# Patient Record
Sex: Male | Born: 1975 | Race: White | Hispanic: No | Marital: Married
Health system: Southern US, Community
[De-identification: ages and names within clinical notes are randomized; demographics above are authoritative.]

---

## 2020-04-02 ENCOUNTER — Inpatient Hospital Stay (HOSPITAL_COMMUNITY)
Admission: EM | Admit: 2020-04-02 | Discharge: 2020-04-02 | DRG: 208 | Disposition: A | Discharge status: Other Acute Inpatient Hospital | Payer: HRSA Program | Attending: Pulmonary Disease | Admitting: Pulmonary Disease

## 2020-04-02 ENCOUNTER — Emergency Department (HOSPITAL_COMMUNITY): Payer: HRSA Program

## 2020-04-02 ENCOUNTER — Inpatient Hospital Stay (HOSPITAL_COMMUNITY): Payer: HRSA Program

## 2020-04-02 DIAGNOSIS — J9601 Acute respiratory failure with hypoxia: Secondary | ICD-10-CM

## 2020-04-02 DIAGNOSIS — J1282 Pneumonia due to coronavirus disease 2019: Secondary | ICD-10-CM | POA: Diagnosis present

## 2020-04-02 DIAGNOSIS — Z452 Encounter for adjustment and management of vascular access device: Secondary | ICD-10-CM

## 2020-04-02 DIAGNOSIS — J9600 Acute respiratory failure, unspecified whether with hypoxia or hypercapnia: Secondary | ICD-10-CM | POA: Diagnosis present

## 2020-04-02 DIAGNOSIS — J80 Acute respiratory distress syndrome: Secondary | ICD-10-CM | POA: Diagnosis present

## 2020-04-02 DIAGNOSIS — U071 COVID-19: Secondary | ICD-10-CM

## 2020-04-02 DIAGNOSIS — Z0189 Encounter for other specified special examinations: Secondary | ICD-10-CM

## 2020-04-02 DIAGNOSIS — C9500 Acute leukemia of unspecified cell type not having achieved remission: Secondary | ICD-10-CM | POA: Diagnosis present

## 2020-04-02 DIAGNOSIS — R140 Abdominal distension (gaseous): Secondary | ICD-10-CM | POA: Insufficient documentation

## 2020-04-02 DIAGNOSIS — K7030 Alcoholic cirrhosis of liver without ascites: Secondary | ICD-10-CM | POA: Diagnosis present

## 2020-04-02 DIAGNOSIS — D72829 Elevated white blood cell count, unspecified: Secondary | ICD-10-CM | POA: Insufficient documentation

## 2020-04-02 LAB — CBC WITH DIFFERENTIAL/PLATELET
Abs Immature Granulocytes: 194.3 10*3/uL — ABNORMAL HIGH (ref 0.00–0.07)
Band Neutrophils: 16 %
Basophils Absolute: 25.9 10*3/uL — ABNORMAL HIGH (ref 0.0–0.1)
Basophils Relative: 4 %
Eosinophils Absolute: 45.3 10*3/uL — ABNORMAL HIGH (ref 0.0–0.5)
Eosinophils Relative: 7 %
HCT: 24.2 % — ABNORMAL LOW (ref 39.0–52.0)
Hemoglobin: 9.1 g/dL — ABNORMAL LOW (ref 13.0–17.0)
Lymphocytes Relative: 2 %
Lymphs Abs: 13 10*3/uL — ABNORMAL HIGH (ref 0.7–4.0)
MCH: 29 pg (ref 26.0–34.0)
MCHC: 37.6 g/dL — ABNORMAL HIGH (ref 30.0–36.0)
MCV: 77.1 fL — ABNORMAL LOW (ref 80.0–100.0)
Metamyelocytes Relative: 13 %
Monocytes Absolute: 0 10*3/uL — ABNORMAL LOW (ref 0.1–1.0)
Monocytes Relative: 0 %
Myelocytes: 11 %
Neutro Abs: 297.9 10*3/uL — ABNORMAL HIGH (ref 1.7–7.7)
Neutrophils Relative %: 30 %
Other: 11 %
Platelets: 375 10*3/uL (ref 150–400)
Promyelocytes Relative: 6 %
RBC: 3.14 MIL/uL — ABNORMAL LOW (ref 4.22–5.81)
RDW: 21.4 % — ABNORMAL HIGH (ref 11.5–15.5)
Smear Review: NORMAL
WBC: 647.6 10*3/uL (ref 4.0–10.5)
nRBC: 1.3 % — ABNORMAL HIGH (ref 0.0–0.2)

## 2020-04-02 LAB — I-STAT ARTERIAL BLOOD GAS, ED
Acid-Base Excess: 0 mmol/L (ref 0.0–2.0)
Acid-base deficit: 1 mmol/L (ref 0.0–2.0)
Acid-base deficit: 8 mmol/L — ABNORMAL HIGH (ref 0.0–2.0)
Bicarbonate: 24.1 mmol/L (ref 20.0–28.0)
Bicarbonate: 22.4 mmol/L (ref 20.0–28.0)
Bicarbonate: 24.4 mmol/L (ref 20.0–28.0)
Calcium, Ion: 1.29 mmol/L (ref 1.15–1.40)
Calcium, Ion: 1.29 mmol/L (ref 1.15–1.40)
Calcium, Ion: 1.33 mmol/L (ref 1.15–1.40)
HCT: 51 % (ref 39.0–52.0)
HCT: 52 % (ref 39.0–52.0)
HCT: 56 % — ABNORMAL HIGH (ref 39.0–52.0)
Hemoglobin: 17.3 g/dL — ABNORMAL HIGH (ref 13.0–17.0)
Hemoglobin: 17.7 g/dL — ABNORMAL HIGH (ref 13.0–17.0)
Hemoglobin: 19 g/dL — ABNORMAL HIGH (ref 13.0–17.0)
O2 Saturation: 59 %
O2 Saturation: 86 %
O2 Saturation: 85 %
Patient temperature: 100
Potassium: 3.1 mmol/L — ABNORMAL LOW (ref 3.5–5.1)
Potassium: 3.6 mmol/L (ref 3.5–5.1)
Potassium: 3.6 mmol/L (ref 3.5–5.1)
Sodium: 139 mmol/L (ref 135–145)
Sodium: 138 mmol/L (ref 135–145)
Sodium: 139 mmol/L (ref 135–145)
TCO2: 25 mmol/L (ref 22–32)
TCO2: 23 mmol/L (ref 22–32)
TCO2: 27 mmol/L (ref 22–32)
pCO2 arterial: 37.8 mmHg (ref 32.0–48.0)
pCO2 arterial: 33 mmHg (ref 32.0–48.0)
pCO2 arterial: 81 mmHg (ref 32.0–48.0)
pH, Arterial: 7.412 (ref 7.350–7.450)
pH, Arterial: 7.439 (ref 7.350–7.450)
pH, Arterial: 7.092 — CL (ref 7.350–7.450)
pO2, Arterial: 30 mmHg — CL (ref 83.0–108.0)
pO2, Arterial: 49 mmHg — ABNORMAL LOW (ref 83.0–108.0)
pO2, Arterial: 73 mmHg — ABNORMAL LOW (ref 83.0–108.0)

## 2020-04-02 LAB — COMPREHENSIVE METABOLIC PANEL
ALT: 22 U/L (ref 0–44)
AST: 78 U/L — ABNORMAL HIGH (ref 15–41)
Albumin: 2.4 g/dL — ABNORMAL LOW (ref 3.5–5.0)
Alkaline Phosphatase: 458 U/L — ABNORMAL HIGH (ref 38–126)
Anion gap: 14 (ref 5–15)
BUN: 14 mg/dL (ref 6–20)
CO2: 21 mmol/L — ABNORMAL LOW (ref 22–32)
Calcium: 8.9 mg/dL (ref 8.9–10.3)
Chloride: 102 mmol/L (ref 98–111)
Creatinine, Ser: 0.72 mg/dL (ref 0.61–1.24)
GFR, Estimated: >60 mL/min (ref >60)
Glucose, Bld: 114 mg/dL — ABNORMAL HIGH (ref 70–99)
Potassium: 5 mmol/L (ref 3.5–5.1)
Sodium: 137 mmol/L (ref 135–145)
Total Bilirubin: 1.3 mg/dL — ABNORMAL HIGH (ref 0.3–1.2)
Total Protein: 6.2 g/dL — ABNORMAL LOW (ref 6.5–8.1)

## 2020-04-02 LAB — PROTIME-INR
INR: 1.2 (ref 0.8–1.2)
Prothrombin Time: 14.7 seconds (ref 11.4–15.2)

## 2020-04-02 LAB — TRIGLYCERIDES: Triglycerides: 261 mg/dL — ABNORMAL HIGH (ref <150)

## 2020-04-02 LAB — LACTATE DEHYDROGENASE: LDH: 1253 U/L — ABNORMAL HIGH (ref 98–192)

## 2020-04-02 LAB — D-DIMER, QUANTITATIVE: D-Dimer, Quant: 3.04 ug/mL-FEU — ABNORMAL HIGH (ref 0.00–0.50)

## 2020-04-02 LAB — POC SARS CORONAVIRUS 2 AG -  ED: SARS Coronavirus 2 Ag: POSITIVE — AB

## 2020-04-02 LAB — APTT: aPTT: 33 seconds (ref 24–36)

## 2020-04-02 LAB — FERRITIN: Ferritin: 216 ng/mL (ref 24–336)

## 2020-04-02 LAB — C-REACTIVE PROTEIN: CRP: 29.5 mg/dL — ABNORMAL HIGH (ref <1.0)

## 2020-04-02 LAB — BRAIN NATRIURETIC PEPTIDE: B Natriuretic Peptide: 177.1 pg/mL — ABNORMAL HIGH (ref 0.0–100.0)

## 2020-04-02 LAB — ETHANOL: Alcohol, Ethyl (B): <10 mg/dL (ref <10)

## 2020-04-02 LAB — AMMONIA: Ammonia: 65 umol/L — ABNORMAL HIGH (ref 9–35)

## 2020-04-02 LAB — LACTIC ACID, PLASMA
Lactic Acid, Venous: 1.9 mmol/L (ref 0.5–1.9)
Lactic Acid, Venous: 1.1 mmol/L (ref 0.5–1.9)

## 2020-04-02 LAB — PROCALCITONIN: Procalcitonin: 1.51 ng/mL

## 2020-04-02 LAB — PATHOLOGIST SMEAR REVIEW

## 2020-04-02 MED ORDER — ETOMIDATE 2 MG/ML IV SOLN
INTRAVENOUS | Status: AC | PRN
  Administered 2020-04-02: 20 mg via INTRAVENOUS

## 2020-04-02 MED ORDER — STERILE WATER FOR INJECTION IV SOLN
INTRAVENOUS | Status: DC
  Filled 2020-04-02: qty 850

## 2020-04-02 MED ORDER — DOCUSATE SODIUM 100 MG PO CAPS: 100.0000 mg | ORAL_CAPSULE | Freq: Two times a day (BID) | ORAL | Status: DC | PRN

## 2020-04-02 MED ORDER — FENTANYL 2500MCG IN NS 250ML (10MCG/ML) PREMIX INFUSION: 0.0000 ug/h | INTRAVENOUS | Status: DC

## 2020-04-02 MED ORDER — SUCCINYLCHOLINE CHLORIDE 200 MG/10ML IV SOSY
PREFILLED_SYRINGE | INTRAVENOUS | Status: AC
  Filled 2020-04-02: qty 10

## 2020-04-02 MED ORDER — MIDAZOLAM HCL 2 MG/2ML IJ SOLN
2.0000 mg | INTRAMUSCULAR | Status: DC | PRN
  Administered 2020-04-02 (×2): 2 mg via INTRAVENOUS
  Filled 2020-04-02 (×2): qty 2

## 2020-04-02 MED ORDER — DEXAMETHASONE SODIUM PHOSPHATE 10 MG/ML IJ SOLN
10.0000 mg | Freq: Once | INTRAMUSCULAR | Status: AC
  Administered 2020-04-02: 10 mg via INTRAVENOUS
  Filled 2020-04-02: qty 1

## 2020-04-02 MED ORDER — FENTANYL 2500MCG IN NS 250ML (10MCG/ML) PREMIX INFUSION
50.0000 ug/h | INTRAVENOUS | Status: DC
  Filled 2020-04-02: qty 250

## 2020-04-02 MED ORDER — POLYETHYLENE GLYCOL 3350 17 G PO PACK: 17.0000 g | PACK | Freq: Every day | ORAL | Status: DC

## 2020-04-02 MED ORDER — ARTIFICIAL TEARS OPHTHALMIC OINT: 1.0000 "application " | TOPICAL_OINTMENT | Freq: Three times a day (TID) | OPHTHALMIC | Status: DC

## 2020-04-02 MED ORDER — SODIUM CHLORIDE 0.9 % IV SOLN
2.0000 g | Freq: Once | INTRAVENOUS | Status: AC
  Administered 2020-04-02: 2 g via INTRAVENOUS
  Filled 2020-04-02: qty 2

## 2020-04-02 MED ORDER — ACETAMINOPHEN 650 MG RE SUPP
650.0000 mg | RECTAL | Status: AC
  Administered 2020-04-02: 650 mg via RECTAL
  Filled 2020-04-02: qty 1

## 2020-04-02 MED ORDER — DOCUSATE SODIUM 50 MG/5ML PO LIQD
100.0000 mg | Freq: Two times a day (BID) | ORAL | Status: DC
  Filled 2020-04-02 (×2): qty 10

## 2020-04-02 MED ORDER — ROCURONIUM BROMIDE 10 MG/ML (PF) SYRINGE
PREFILLED_SYRINGE | INTRAVENOUS | Status: AC
  Filled 2020-04-02: qty 10

## 2020-04-02 MED ORDER — FENTANYL CITRATE (PF) 100 MCG/2ML IJ SOLN: 50.0000 ug | Freq: Once | INTRAMUSCULAR | Status: DC

## 2020-04-02 MED ORDER — ETOMIDATE 2 MG/ML IV SOLN
INTRAVENOUS | Status: AC
  Filled 2020-04-02: qty 20

## 2020-04-02 MED ORDER — SODIUM CHLORIDE 0.9 % IV SOLN: INTRAVENOUS | Status: DC

## 2020-04-02 MED ORDER — POLYETHYLENE GLYCOL 3350 17 G PO PACK: 17.0000 g | PACK | Freq: Every day | ORAL | Status: DC | PRN

## 2020-04-02 MED ORDER — SODIUM CHLORIDE 0.9 % IV SOLN
100.0000 mg | Freq: Every day | INTRAVENOUS | Status: DC
  Filled 2020-04-02: qty 20

## 2020-04-02 MED ORDER — MIDAZOLAM HCL 2 MG/2ML IJ SOLN: 2.0000 mg | INTRAMUSCULAR | Status: DC | PRN

## 2020-04-02 MED ORDER — SODIUM CHLORIDE 0.9 % IV SOLN
200.0000 mg | Freq: Once | INTRAVENOUS | Status: AC
  Administered 2020-04-02: 200 mg via INTRAVENOUS
  Filled 2020-04-02: qty 200

## 2020-04-02 MED ORDER — FENTANYL BOLUS VIA INFUSION
50.0000 ug | INTRAVENOUS | Status: DC | PRN
Start: 2020-04-02 — End: 2020-04-03
  Filled 2020-04-02: qty 50

## 2020-04-02 MED ORDER — ROCURONIUM BROMIDE 50 MG/5ML IV SOLN
INTRAVENOUS | Status: AC | PRN
  Administered 2020-04-02: 50 mg via INTRAVENOUS

## 2020-04-02 MED ORDER — FENTANYL 2500MCG IN NS 250ML (10MCG/ML) PREMIX INFUSION
0.0000 ug/h | INTRAVENOUS | Status: DC
  Administered 2020-04-02: 200 ug/h via INTRAVENOUS

## 2020-04-02 MED ORDER — DEXMEDETOMIDINE HCL IN NACL 400 MCG/100ML IV SOLN
0.0000 ug/kg/h | INTRAVENOUS | Status: DC
  Filled 2020-04-02 (×2): qty 100

## 2020-04-02 MED ORDER — VECURONIUM BROMIDE 10 MG IV SOLR
10.0000 mg | INTRAVENOUS | Status: DC | PRN
Start: 2020-04-02 — End: 2020-04-03

## 2020-04-02 NOTE — ED Notes (Signed)
RN notified of high temps

## 2020-04-02 NOTE — Discharge Summary (Signed)
Physician Discharge Summary         Patient ID: Darren Bennett MRN: [id number] DOB/AGE: 44-May-1977 44 y.o.  Admit date: 04/02/2020 Discharge date: 04/02/2020  Discharge Diagnoses:   Acute Leukemia Acute respiratory failure secondary to Covid-19 Pneumonia Possible Cirrhosis  Discharge summary   45 y.o. M with PMH of ETOH abuse though quit about one year ago, who presented with two days of cough and shortness of breath. He states his wife has similar symptoms, he is not vaccinated. On EMS arrival, oxygen saturations reportedly in the 60%'s, he was placed on non-rebreather in the ED, but required increasing respiratory support up to 40L HFNC, so PCCM was consulted.   CXR with patchy bilateral infiltrates and WBC 647 with normal lactic acid and renal function.  He has significant abdominal and LE edema, however patient states that this has been present for about one year.  He has not seen a medical provider for this.  He denies abdominal pain.    Pt's respiratory status continued to deteriorate and he required intubation.   Oncology was consulted and reviewed peripheral smear which was consistent with an acute Leukemia.  Transfer to Oncology intensive care at [facility] recommended.    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 Lennox Grumbles, may need plasmapheresis    Acute hypoxic respiratory failure secondary to COVID-19 Intubated in the emergency department, unvaccinated P: -Significantly hypoxic post intubation, improving with sedation -On Fentanyl and Precedex -May require paralyzing and proning -continue steroids, hold further Remdesevir as unlikely to provide benefit at this point of severe infection -follow inflammatory markers -High PEEP/ Low tidal volume ventilation for ARDS, 6cc/kg --Maintain full vent support with SAT/SBT as  tolerated -titrate Vent setting to maintain SpO2 greater than or equal to 90%. -HOB elevated 30 degrees. -Plateau pressures less than 30 cm H20, goal driving pressure <40JW h2o -Follow chest x-ray, ABGprn.  -Bronchial hygiene and RT/bronchodilator protocol.     History of alcohol abuse, likely cirrhosis LFTs not markedly elevated, however exam appears consistent P: -Continue cefepime for SBP prophylaxis -Will likely need abdominal ultrasound and paracentesis once stabilized    Significant Hospital tests/ studies  1/26 CXR>>multi-focal PNA   Procedures   1/26 ETT 1/26 L IJ CVC  Culture data/antimicrobials   1/26 Covid-19>>positive 1/26 BCx2>>   Consults  Oncology    Discharge Exam: BP 123/90   Pulse (!) 120   Temp 99.6 F (37.6 C)   Resp (!) 32   Ht 5\' 7"  (1.702 m)   Wt 66.2 kg   SpO2 92%   BMI 22.86 kg/m   General:  Chronically ill-appearing M, intubated and sedated  HEENT: MM pink/moist, ETT in place on full vent support Neuro: sedated on Fentanyl, Rass -2 CV: s1s2 rrr, no m/r/g PULM:  On full vent support, PRVC, PEEP 18, TV 400, RR 35, FiO2 100% GI: soft, distended, +BS Extremities: warm/dry, 3+ edema to thighs Skin: no rashes or lesions  Labs at discharge   Lab Results  Component Value Date   CREATININE 0.72 04/02/2020   BUN 14 04/02/2020   NA 139 04/02/2020   K 3.6 04/02/2020   CL 102 04/02/2020   CO2 21 (L) 04/02/2020   Lab Results  Component Value Date   WBC 647.6 (HH) 04/02/2020   HGB 19.0 (H) 04/02/2020   HCT 56.0 (H) 04/02/2020   MCV 77.1 (  L) 04/02/2020   PLT 375 04/02/2020   Lab Results  Component Value Date   ALT 22 04/02/2020   AST 78 (H) 04/02/2020   ALKPHOS 458 (H) 04/02/2020   BILITOT 1.3 (H) 04/02/2020   Lab Results  Component Value Date   INR 1.2 04/02/2020    Current radiological studies    DG Chest Port 1 View  Result Date: 04/02/2020 CLINICAL DATA:  Central line and OG tube placement. EXAM: PORTABLE  ABDOMEN - 1 VIEW; PORTABLE CHEST - 1 VIEW COMPARISON:  04/02/2020. FINDINGS: Chest: ETT tip approximately 5.5 cm above the carina. No pneumothorax. Left apex is partially obscured. Patchy bilateral pulmonary opacities. Small right pleural effusion. Stable cardiomediastinal silhouette. Left IJ CVC tip overlies the upper right atrium. Non weighted enteric tube tip and side hole overlie the gastric body. Paucity of bowel gas. IMPRESSION: Support devices as detailed above.  No pneumothorax. Multifocal pneumonia.  Small right pleural effusion. Electronically Signed   By: Primitivo Gauze M.D.   On: 04/02/2020 16:34   DG Chest Port 1 View  Result Date: 04/02/2020 CLINICAL DATA:  Shortness of breath and hypoxia EXAM: PORTABLE CHEST 1 VIEW COMPARISON:  None. FINDINGS: Cardiac shadows within normal limits. Diffuse airspace opacity is identified consistent with multifocal pneumonia. No sizable effusion is seen. No bony abnormality is noted. IMPRESSION: Multifocal airspace opacities consistent with the given clinical history of COVID-19 positivity. Electronically Signed   By: Inez Catalina M.D.   On: 04/02/2020 12:19   DG Abd Portable 1V  Result Date: 04/02/2020 CLINICAL DATA:  Central line and OG tube placement. EXAM: PORTABLE ABDOMEN - 1 VIEW; PORTABLE CHEST - 1 VIEW COMPARISON:  04/02/2020. FINDINGS: Chest: ETT tip approximately 5.5 cm above the carina. No pneumothorax. Left apex is partially obscured. Patchy bilateral pulmonary opacities. Small right pleural effusion. Stable cardiomediastinal silhouette. Left IJ CVC tip overlies the upper right atrium. Non weighted enteric tube tip and side hole overlie the gastric body. Paucity of bowel gas. IMPRESSION: Support devices as detailed above.  No pneumothorax. Multifocal pneumonia.  Small right pleural effusion. Electronically Signed   By: Primitivo Gauze M.D.   On: 04/02/2020 16:34    Disposition:    [facility] Intensive care      Follow-up  appointment      Discharge Condition:    critical  Darren Carpen Jaquae Rieves, PA-C [place]  See AMION for pager details

## 2020-04-02 NOTE — H&P (Addendum)
NAME:  Darren Bennett, MRN:  [id number], DOB:  1975-05-13, LOS: 0 ADMISSION DATE:  04/02/2020, CONSULTATION DATE:  04/02/20 REFERRING MD:  EDP, CHIEF COMPLAINT:  Shortness of breath   Brief History:  45 y.o. M with PMH of ETOH abuse which he quit about one year ago who presented with two days of cough and shortness of breath.  Oxygen saturations reportedly in the 60s on EMS arrival.  He was Covid positive and had increasing oxygen requirement ultimately requiring intubation  History of Present Illness:  45 y.o. M with PMH of ETOH abuse which he quit about one year ago who presented with two days of cough and shortness of breath. He states his wife has similar symptoms, he is not vaccinated. On EMS arrival, oxygen saturations reportedly in the 60%'s, he was placed on non-rebreather in the ED, but required increasing respiratory support up to 40L HFNC, so PCCM was consulted.   CXR with patchy bilateral infiltrates and WBC 647 with normal lactic acid and renal function.  He has significant abdominal and LE edema, however patient states that this has been present for about one year.  He has not seen a medical provider for this.  He denies abdominal pain.    Pt's respiratory status continued to deteriorate and he required intubation.   Past Medical History:  ETOH abuse  Significant Hospital Events:  1/26 Intubated and admit to PCCM  C1onsults:  Oncology  Procedures:  1/26 ETT 1/26 L IJ CVC  Significant Diagnostic Tests:  1/26 CXR>>multi-focal PNA   Micro Data:  1/26 Covid-19>>positive 1/26 BCx2>>  Antimicrobials:  Cefepime 1/26   Interim History / Subjective:    Objective   Blood pressure 114/81, pulse (!) 122, temperature 98.4 F (36.9 C), temperature source Oral, resp. rate (!) 49, height 5\' 7"  (1.702 m), SpO2 (!) 73 %.    Vent Mode: PRVC FiO2 (%):  [100 %] 100 % Set Rate:  [16 bmp] 16 bmp Vt Set:  [400 mL] 400 mL PEEP:  [18 cmH20] 18 cmH20  No intake or output data  in the 24 hours ending 04/02/20 1606 There were no vitals filed for this visit.  General:  Chronically ill-appearing M, in significant respiratory distress on HFNC HEENT: MM pink/moist Neuro: initially awake, alert and oriented CV: s1s2 rrr, no m/r/g PULM:  Rhonchi bilateral bases, tachpnic  GI: soft, severe abdominal distension, +BS Extremities: warm/dry, 3+ pitting edema  Skin: no rashes or lesions   Resolved Hospital Problem list     Assessment & Plan:    Acute hypoxic respiratory failure secondary to COVID-19 Intubated in the emergency department, unvaccinated P: -Significantly hypoxic post intubation, as needed paralytics ordered -May require proning -continue steroids, hold further Remdesevir as unlikely to provide benefit at this point of severe infection -follow inflammatory markers -High PEEP/ Low tidal volume ventilation for ARDS, 6cc/kg --Maintain full vent support with SAT/SBT as tolerated -titrate Vent setting to maintain SpO2 greater than or equal to 90%. -HOB elevated 30 degrees. -Plateau pressures less than 30 cm H20, goal driving pressure <10UV O5D -Follow chest x-ray, ABG prn.   -Bronchial hygiene and RT/bronchodilator protocol.   Severe leukocytosis No prior labs in epic for comparison P: -Dr. Tonia Brooms spoke with oncology, concern for acute leukemia may need possible transfer for pheresis   History of alcohol abuse, likely cirrhosis LFTs not markedly elevated, however exam appears consistent P: -Continue cefepime for SBP prophylaxis -Will likely need paracentesis once stabilized   Best practice (evaluated daily)  Diet: N.p.o. Pain/Anxiety/Delirium protocol (if indicated): Fentanyl, propofol VAP protocol (if indicated): Head of bed 30 degrees, suction as needed DVT prophylaxis: SCDs GI prophylaxis: Protonix Glucose control: SSI Mobility: Bedrest Disposition: ICU Family communication: Wife updated prior to intubation  Goals of Care:  Last date  of multidisciplinary goals of care discussion: Family and staff present:  Summary of discussion:  Follow up goals of care discussion due: 2/2 Code Status: Full code  Labs   CBC: Recent Labs  Lab 04/02/20 1150 04/02/20 1217 04/02/20 1231  WBC 647.6*  --   --   NEUTROABS 297.9*  --   --   HGB 9.1* 17.3* 17.7*  HCT 24.2* 51.0 52.0  MCV 77.1*  --   --   PLT 375  --   --     Basic Metabolic Panel: Recent Labs  Lab 04/02/20 1150 04/02/20 1217 04/02/20 1231  NA 137 139 138  K 5.0 3.1* 3.6  CL 102  --   --   CO2 21*  --   --   GLUCOSE 114*  --   --   BUN 14  --   --   CREATININE 0.72  --   --   CALCIUM 8.9  --   --    GFR: CrCl cannot be calculated (Unknown ideal weight.). Recent Labs  Lab 04/02/20 1150 04/02/20 1217 04/02/20 1346 04/02/20 1402  PROCALCITON  --   --   --  1.51  WBC 647.6*  --   --   --   LATICACIDVEN  --  1.9 1.1  --     Liver Function Tests: Recent Labs  Lab 04/02/20 1150  AST 78*  ALT 22  ALKPHOS 458*  BILITOT 1.3*  PROT 6.2*  ALBUMIN 2.4*   No results for input(s): LIPASE, AMYLASE in the last 168 hours. Recent Labs  Lab 04/02/20 1402  AMMONIA 65*    ABG    Component Value Date/Time   PHART 7.439 04/02/2020 1231   PCO2ART 33.0 04/02/2020 1231   PO2ART 49 (L) 04/02/2020 1231   HCO3 22.4 04/02/2020 1231   TCO2 23 04/02/2020 1231   ACIDBASEDEF 1.0 04/02/2020 1231   O2SAT 86.0 04/02/2020 1231     Coagulation Profile: Recent Labs  Lab 04/02/20 1346  INR 1.2    Cardiac Enzymes: No results for input(s): CKTOTAL, CKMB, CKMBINDEX, TROPONINI in the last 168 hours.  HbA1C: No results found for: HGBA1C  CBG: No results for input(s): GLUCAP in the last 168 hours.  Review of Systems:   Negative except as noted in HPI  Past Medical History:  He,  has no past medical history on file.   Surgical History:    Social History:      Family History:  His family history is not on file.   Allergies No Known Allergies    Home Medications  Prior to Admission medications   Not on File     Critical care time: 55 minutes    CRITICAL CARE Performed by: Otilio Carpen Gleason   Total critical care time: 55 minutes  Critical care time was exclusive of separately billable procedures and treating other patients.  Critical care was necessary to treat or prevent imminent or life-threatening deterioration.  Critical care was time spent personally by me on the following activities: development of treatment plan with patient and/or surrogate as well as nursing, discussions with consultants, evaluation of patient's response to treatment, examination of patient, obtaining history from patient or surrogate, ordering and performing  treatments and interventions, ordering and review of laboratory studies, ordering and review of radiographic studies, pulse oximetry and re-evaluation of patient's condition.  Otilio Carpen Gleason, PA-C See AMION for pager details    PCCM Attending:   45 yo COVID19 positive, BL infiltrates, WBC >600K, possible acute leukemia.   Case discussed with Dr. Chryl Heck. She reviewed path with Dr. Melina Copa.   BP 123/90   Pulse (!) 120   Temp 99.6 F (37.6 C)   Resp (!) 32   Ht 5\' 7"  (1.702 m)   Wt 66.2 kg   SpO2 92%   BMI 22.86 kg/m   Gen: male, intubated on life support, frail Heart: RRR, s1 s2  Lungs: BL vented breaths  Abd: distended, ascites present  Ext: BL LE edema   Labs reviewed  A: AHRF COVID19  Severe Leukocytosis, possible acute leukemia  ?possible leukocyte larceny  History of alcohol abuse  Likely underlying cirrhosis   P: On mechanical vent  remdesivir  Transfer to [facility] for possible apheresis and acute leukemia evaluation  Has a post intubate resp acidosis, vent changes made to increase MVe  Continue cefepime empirically   I called and spoke with Dr. Lennox Grumbles from [facility] to discuss case.    Team has spoke with family as well  This patient is critically ill with  multiple organ system failure; which, requires frequent high complexity decision making, assessment, support, evaluation, and titration of therapies. This was completed through the application of advanced monitoring technologies and extensive interpretation of multiple databases. During this encounter critical care time was devoted to patient care services described in this note for 76 minutes.  [facility] 04/02/2020 5:47 PM

## 2020-04-02 NOTE — ED Triage Notes (Signed)
Pt to ED via EMS from home c/o [facility], Abdominal swelling, leg swelling. On EMS arrival pt o2 sats 60s, RA ; pt placed on nonrebreather sats 82%, pt SHOB started yesterday. Progressively getting worse. Reports abdominal swelling over the past year, states use to drink a lot of Whiskey, but has not been drinking over the past year. Pt does not see a doctor regularly. #18 RAC, 4MG  Zofran given by EMS. Last VS 132 88, hr 124, 82%NRB,

## 2020-04-02 NOTE — ED Notes (Signed)
Date and time results received: 04/02/20  Test: wbc Critical Value: 647.6  Name of Provider Notified: Zenia Resides MD

## 2020-04-02 NOTE — ED Notes (Signed)
Critical care at bedside  

## 2020-04-02 NOTE — Code Documentation (Addendum)
ET placed

## 2020-04-02 NOTE — Progress Notes (Signed)
I was called about his very high leukocyte count this afternoon. Patient was intubated and sedated given severe hypoxia, COVID 19. I reviewed the smear findings with Dr Melina Copa, smear does indeed show hyperleukocytosis, her suspicion is a possible leukemia, APML vs leukemoid reaction vs CML. Given need for leukapheresis and hyperleukocytosis, I have advised Dr Valeta Harms to transfer to [facility] ASAP. HE expressed understanding I tried calling number on file to discuss the above mentioned findings, no answer, I didn't leave any patient details in the voicemail. Discussed this with nursing team as well.

## 2020-04-02 NOTE — Procedures (Signed)
Central Venous Catheter Insertion Procedure Note  Darren Bennett  [id number]  November 25, 1975  Date:04/02/20  Time:3:55 PM   Provider Performing:Darren Bennett   Procedure: Insertion of Non-tunneled Central Venous Catheter(36556) with US guidance (15945)   Indication(s) Medication administration  Consent Unable to obtain consent due to emergent nature of procedure.  Anesthesia Topical only with 1% lidocaine   Timeout Verified patient identification, verified procedure, site/side was marked, verified correct patient position, special equipment/implants available, medications/allergies/relevant history reviewed, required imaging and test results available.  Sterile Technique Maximal sterile technique including full sterile barrier drape, hand hygiene, sterile gown, sterile gloves, mask, hair covering, sterile ultrasound probe cover (if used).  Procedure Description Area of catheter insertion was cleaned with chlorhexidine and draped in sterile fashion.  With real-time ultrasound guidance a central venous catheter was placed into the left internal jugular vein. Nonpulsatile blood flow and easy flushing noted in all ports.  The catheter was sutured in place and sterile dressing applied.  Complications/Tolerance None; patient tolerated the procedure well. Chest X-ray is ordered to verify placement for internal jugular or subclavian cannulation.   Chest x-ray is not ordered for femoral cannulation.  EBL Minimal  Specimen(s) None    Darren Carpen [name], PA-C

## 2020-04-02 NOTE — Progress Notes (Signed)
RN notified of critical results on ABG. RR increased to 34, no other changes made.

## 2020-04-02 NOTE — ED Notes (Signed)
Date and time results received: 04/02/20  Test: COVID POC Critical Value: Positive   Name of Provider Notified: Zenia Resides MD

## 2020-04-02 NOTE — ED Provider Notes (Addendum)
[place] EMERGENCY DEPARTMENT Provider Note   CSN: [id number] Arrival date & time: 04/02/20  1121     History Chief Complaint  Patient presents with  . Shortness of Breath  . Leg Swelling    Darren Bennett is a 45 y.o. male.  45 year old male presents with increasing dyspnea times several days.  Has a history of alcohol abuse about a year ago and states he has had no alcohol since that time.  Has noted increasing lower extremity edema along with abdominal distention.  He denies any fever or chills.  He denies any abdominal discomfort.  Does note darker than normal urine.  Denies any upper or lower GI bleeding symptoms.  Endorses orthopnea as well as dyspnea on exertion.  Denies any chest pain.  No treatment use prior to arrival.        No past medical history on file.  There are no problems to display for this patient.   CRITICAL CARE Performed by: Leota Jacobsen Total critical care time: 70 minutes Critical care time was exclusive of separately billable procedures and treating other patients. Critical care was necessary to treat or prevent imminent or life-threatening deterioration. Critical care was time spent personally by me on the following activities: development of treatment plan with patient and/or surrogate as well as nursing, discussions with consultants, evaluation of patient's response to treatment, examination of patient, obtaining history from patient or surrogate, ordering and performing treatments and interventions, ordering and review of laboratory studies, ordering and review of radiographic studies, pulse oximetry and re-evaluation of patient's condition.      No family history on file.     Home Medications Prior to Admission medications   Not on File    Allergies    Patient has no allergy information on record.  Review of Systems   Review of Systems  All other systems reviewed and are negative.   Physical Exam Updated  Vital Signs BP 115/79   Pulse (!) 121   Temp 98.4 F (36.9 C) (Oral)   Resp (!) 44   SpO2 (!) 87%   Physical Exam Vitals and nursing note reviewed.  Constitutional:      General: He is not in acute distress.    Appearance: Normal appearance. He is well-developed and well-nourished. He is not toxic-appearing.  HENT:     Head: Normocephalic and atraumatic.  Eyes:     General: Lids are normal.     Extraocular Movements: EOM normal.     Conjunctiva/sclera: Conjunctivae normal.     Pupils: Pupils are equal, round, and reactive to light.  Neck:     Thyroid: No thyroid mass.     Trachea: No tracheal deviation.  Cardiovascular:     Rate and Rhythm: Regular rhythm. Tachycardia present.     Heart sounds: Normal heart sounds. No murmur heard. No gallop.   Pulmonary:     Effort: Pulmonary effort is normal. No respiratory distress.     Breath sounds: No stridor. Examination of the right-upper field reveals decreased breath sounds. Examination of the left-upper field reveals decreased breath sounds. Decreased breath sounds present. No wheezing, rhonchi or rales.  Abdominal:     General: There is distension.     Palpations: There is fluid wave. There is no mass.     Tenderness: There is no abdominal tenderness. There is no CVA tenderness or rebound.  Musculoskeletal:        General: No tenderness or edema. Normal range of motion.  Cervical back: Normal range of motion and neck supple.     Comments: 3+ bilateral lower extremity pitting edema  Skin:    General: Skin is warm and dry.     Findings: No abrasion or rash.  Neurological:     Mental Status: He is alert and oriented to person, place, and time.     GCS: GCS eye subscore is 4. GCS verbal subscore is 5. GCS motor subscore is 6.     Cranial Nerves: No cranial nerve deficit.     Sensory: No sensory deficit.     Deep Tendon Reflexes: Strength normal.  Psychiatric:        Mood and Affect: Mood and affect normal.        Speech:  Speech normal.        Behavior: Behavior normal.     ED Results / Procedures / Treatments   Labs (all labs ordered are listed, but only abnormal results are displayed) Labs Reviewed  CBC WITH DIFFERENTIAL/PLATELET  COMPREHENSIVE METABOLIC PANEL  PROTIME-INR  APTT  ETHANOL  BRAIN NATRIURETIC PEPTIDE  BLOOD GAS, ARTERIAL  POC SARS CORONAVIRUS 2 AG -  ED    EKG EKG Interpretation  Date/Time:  Wednesday April 02 2020 11:38:17 EST Ventricular Rate:  124 PR Interval:    QRS Duration: 89 QT Interval:  348 QTC Calculation: 500 R Axis:   95 Text Interpretation: Sinus tachycardia Probable left atrial enlargement Borderline right axis deviation Probable left ventricular hypertrophy Borderline prolonged QT interval No old tracing to compare Confirmed by Lacretia Leigh ([id number]) on 04/02/2020 11:42:51 AM   Radiology No results found.  Procedures Procedures   Medications Ordered in ED Medications  0.9 %  sodium chloride infusion (has no administration in time range)    ED Course  I have reviewed the triage vital signs and the nursing notes.  Pertinent labs & imaging results that were available during my care of the patient were reviewed by me and considered in my medical decision making (see chart for details).    MDM Rules/Calculators/A&P                          Patient's blood gas noted with severe hypoxemia.  Rapid Covid test positive.  Chest x-ray consistent with Covid.  Patient also neutropenic and given empiric antibiotics.  Started on Decadron and remdesivir for Covid infection.  Patient maintaining his airway at this time.  He is on high flow oxygen.  Will admit to the hospital service  Darren Bennett was evaluated in Emergency Department on 04/02/2020 for the symptoms described in the history of present illness. He was evaluated in the context of the global COVID-19 pandemic, which necessitated consideration that the patient might be at risk for infection with the  SARS-CoV-2 virus that causes COVID-19. Institutional protocols and algorithms that pertain to the evaluation of patients at risk for COVID-19 are in a state of rapid change based on information released by regulatory bodies including the CDC and federal and state organizations. These policies and algorithms were followed during the patient's care in the ED.  Final Clinical Impression(s) / ED Diagnoses Final diagnoses:  None    Rx / DC Orders ED Discharge Orders    None       Lacretia Leigh, MD 04/02/20 1338    Lacretia Leigh, MD 04/02/20 1408

## 2020-04-02 NOTE — Progress Notes (Signed)
Updated patient's wife regarding pending transfer to [facility], she is in agreement with the plan and asks that she be updated with any changes overnight.    Otilio Carpen Stuart Guillen, PA-C

## 2020-04-02 NOTE — ED Notes (Signed)
[facility] called @ 1840-(transport to [facility], RN called by Levada Dy

## 2020-04-02 NOTE — Procedures (Signed)
Intubation Procedure Note  Darren Bennett  [id number]  [birth year]  Date:04/02/20  Time:3:23 PM   Provider Performing:Janellie Tennison L Pollyanna Levay   Procedure: Intubation (31500)  Indication(s) Respiratory Failure  Consent Risks of the procedure as well as the alternatives and risks of each were explained to the patient and/or caregiver.  Consent for the procedure was obtained and is signed in the bedside chart  Anesthesia Etomidate and Rocuronium   Time Out Verified patient identification, verified procedure, site/side was marked, verified correct patient position, special equipment/implants available, medications/allergies/relevant history reviewed, required imaging and test results available.  Sterile Technique Usual hand hygeine, masks, and gloves were used  Procedure Description Patient positioned in bed supine.  Sedation given as noted above.  Patient was intubated with endotracheal tube using Glidescope.  View was Grade 1 full glottis .  Number of attempts was 1.  Colorimetric CO2 detector was consistent with tracheal placement.  Complications/Tolerance None; patient tolerated the procedure well. Chest X-ray is ordered to verify placement.  EBL o cc  Specimen(s) None  Garner Nash, DO [facility] 04/02/2020 3:24 PM

## 2020-04-07 LAB — CULTURE, BLOOD (ROUTINE X 2)
Culture: NO GROWTH
Special Requests: ADEQUATE

## 2022-09-16 IMAGING — DX DG ABD PORTABLE 1V
1 series · 1 of 1 positions shown · non-contrast
Comparison: 04/02/2020.

CLINICAL DATA: Central line and OG tube placement.

EXAM:
PORTABLE ABDOMEN - 1 VIEW; PORTABLE CHEST - 1 VIEW

[abdomen kub]
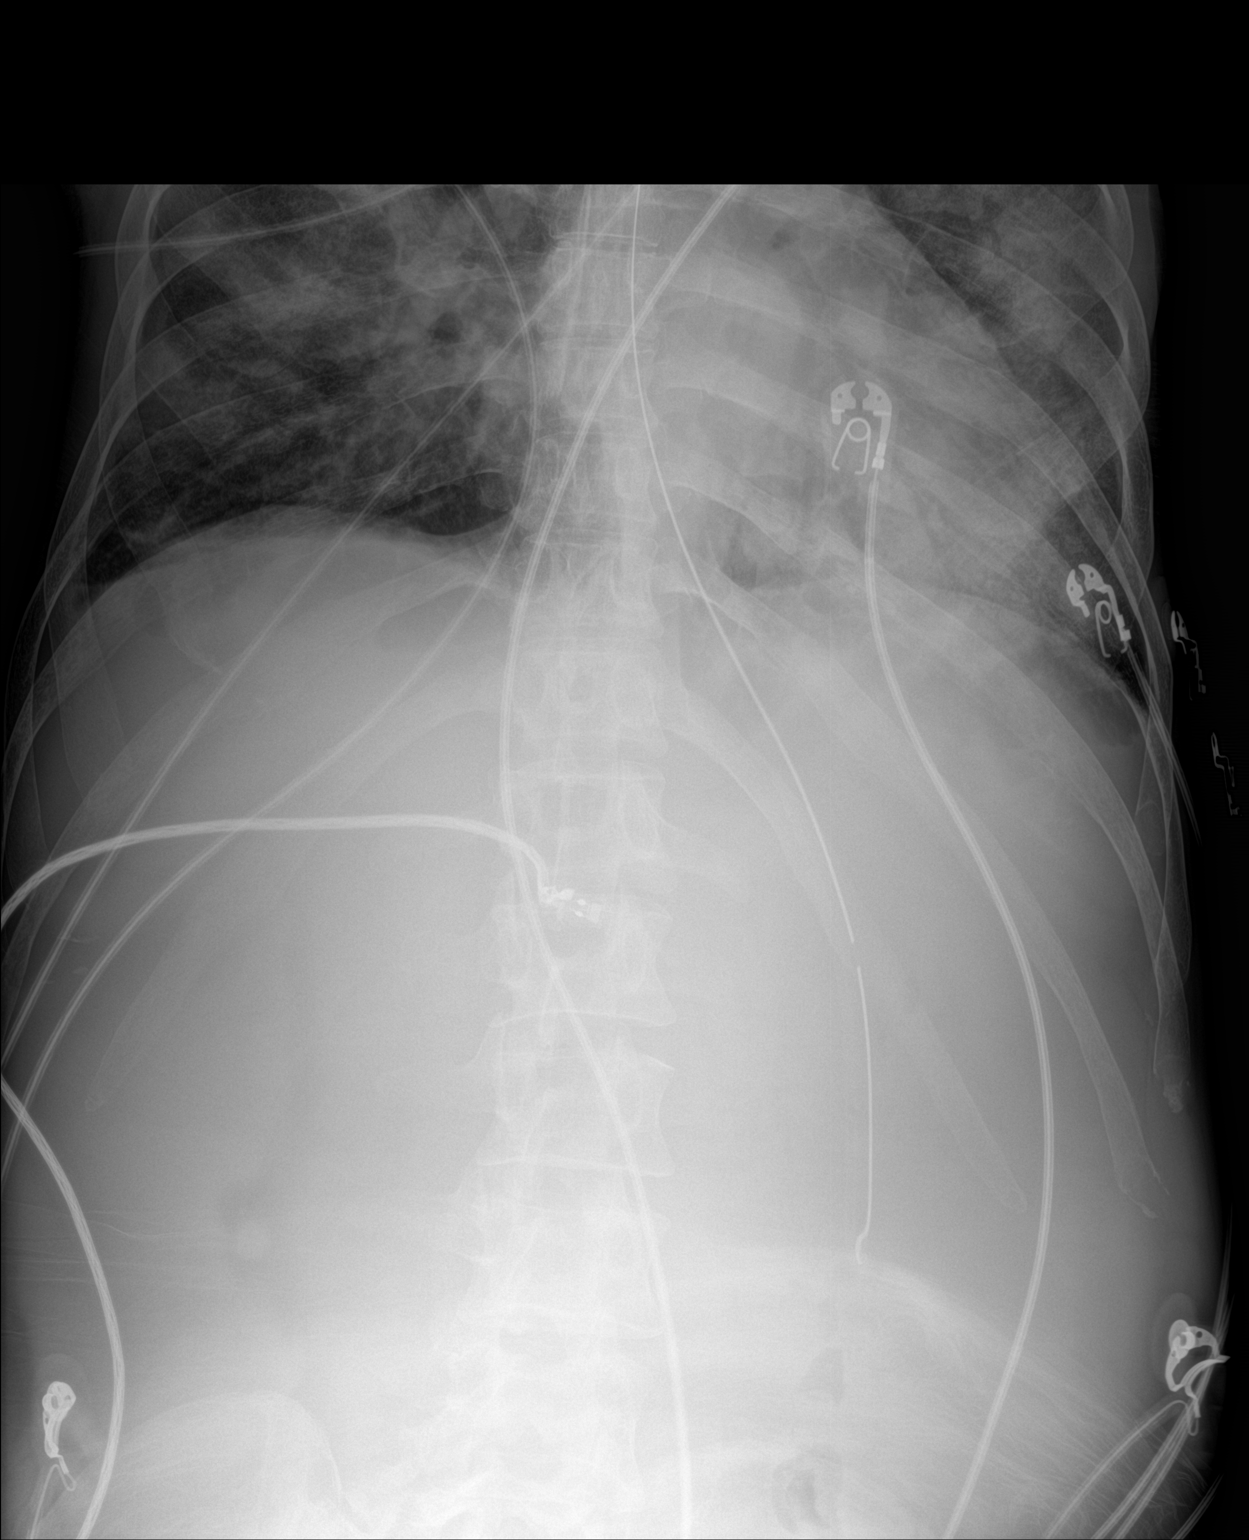

[1 of 1 positions shown; findings below may reference images not displayed]

FINDINGS: Chest: ETT tip approximately 5.5 cm above the carina. No
pneumothorax. Left apex is partially obscured. Patchy bilateral
pulmonary opacities. Small right pleural effusion. Stable
cardiomediastinal silhouette.

Left IJ CVC tip overlies the upper right atrium. Non weighted
enteric tube tip and side hole overlie the gastric body. Paucity of
bowel gas.
IMPRESSION: Support devices as detailed above.  No pneumothorax.

Multifocal pneumonia.  Small right pleural effusion.

## 2022-09-16 IMAGING — DX DG CHEST 1V PORT
1 series · 1 of 1 positions shown · non-contrast
Comparison: 04/02/2020.

CLINICAL DATA: Central line and OG tube placement.

EXAM:
PORTABLE ABDOMEN - 1 VIEW; PORTABLE CHEST - 1 VIEW

[chest ap]
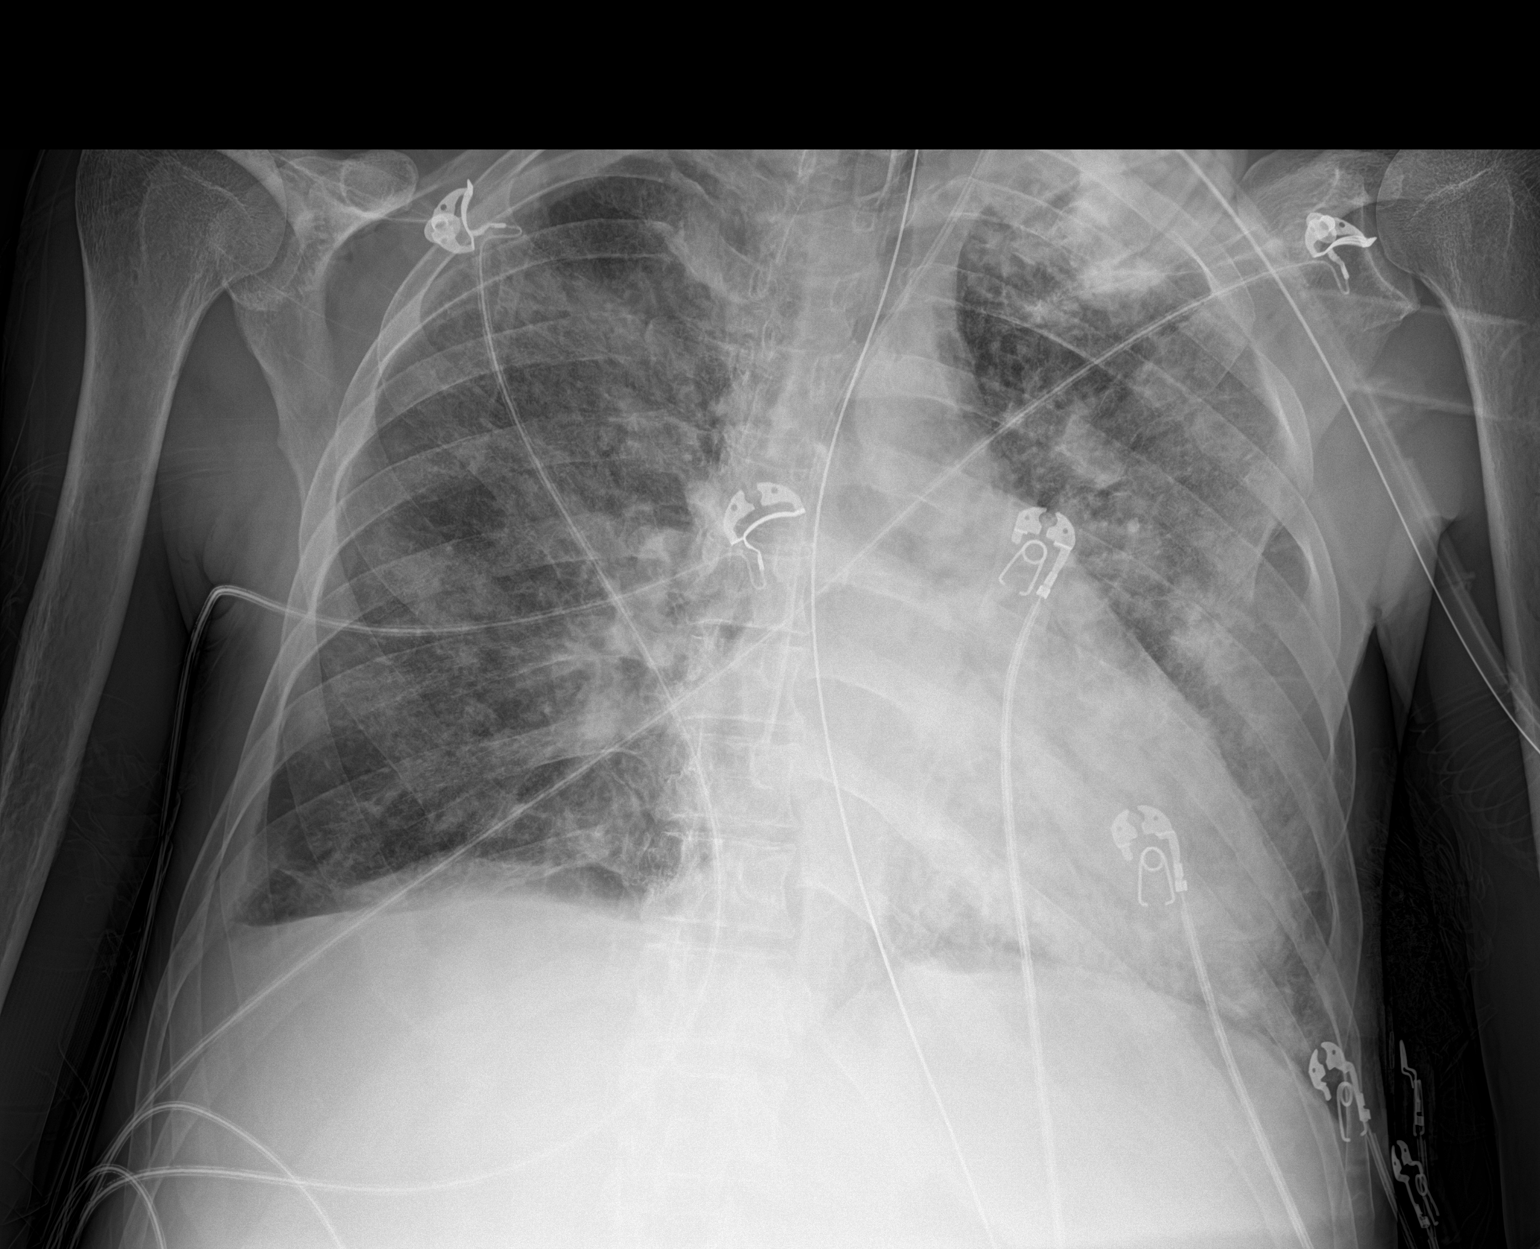

[1 of 1 positions shown; findings below may reference images not displayed]

FINDINGS: Chest: ETT tip approximately 5.5 cm above the carina. No
pneumothorax. Left apex is partially obscured. Patchy bilateral
pulmonary opacities. Small right pleural effusion. Stable
cardiomediastinal silhouette.

Left IJ CVC tip overlies the upper right atrium. Non weighted
enteric tube tip and side hole overlie the gastric body. Paucity of
bowel gas.
IMPRESSION: Support devices as detailed above.  No pneumothorax.

Multifocal pneumonia.  Small right pleural effusion.
